# Patient Record
Sex: Female | Born: 1950 | Race: White | Hispanic: No | Marital: Married
Health system: Southern US, Community
[De-identification: ages and names within clinical notes are randomized; demographics above are authoritative.]

## PROBLEM LIST (undated history)

## (undated) DIAGNOSIS — M8580 Other specified disorders of bone density and structure, unspecified site: Secondary | ICD-10-CM

## (undated) DIAGNOSIS — I10 Essential (primary) hypertension: Secondary | ICD-10-CM

## (undated) DIAGNOSIS — E229 Hyperfunction of pituitary gland, unspecified: Secondary | ICD-10-CM

## (undated) DIAGNOSIS — N200 Calculus of kidney: Secondary | ICD-10-CM

## (undated) DIAGNOSIS — R7989 Other specified abnormal findings of blood chemistry: Secondary | ICD-10-CM

## (undated) HISTORY — PX: OTHER SURGICAL HISTORY: SHX169

## (undated) HISTORY — DX: Hyperfunction of pituitary gland, unspecified: E22.9

## (undated) HISTORY — DX: Other specified abnormal findings of blood chemistry: R79.89

## (undated) HISTORY — DX: Other specified disorders of bone density and structure, unspecified site: M85.80

## (undated) HISTORY — DX: Essential (primary) hypertension: I10

## (undated) HISTORY — DX: Calculus of kidney: N20.0

---

## 1995-01-02 HISTORY — PX: CYSTOSCOPY W/ URETERAL STENT PLACEMENT: SHX1429

## 1997-08-06 ENCOUNTER — Ambulatory Visit (HOSPITAL_BASED_OUTPATIENT_CLINIC_OR_DEPARTMENT_OTHER): Admission: RE | Admit: 1997-08-06 | Discharge: 1997-08-06 | Payer: Self-pay | Admitting: Urology

## 1997-08-12 ENCOUNTER — Ambulatory Visit (HOSPITAL_COMMUNITY): Admission: RE | Admit: 1997-08-12 | Discharge: 1997-08-12 | Payer: Self-pay | Admitting: Urology

## 1997-08-27 ENCOUNTER — Other Ambulatory Visit: Admission: RE | Admit: 1997-08-27 | Discharge: 1997-08-27 | Payer: Self-pay | Admitting: Obstetrics and Gynecology

## 1997-10-11 ENCOUNTER — Ambulatory Visit (HOSPITAL_COMMUNITY): Admission: RE | Admit: 1997-10-11 | Discharge: 1997-10-11 | Payer: Self-pay | Admitting: Urology

## 1997-10-11 ENCOUNTER — Encounter: Payer: Self-pay | Admitting: Urology

## 1998-10-24 ENCOUNTER — Other Ambulatory Visit: Admission: RE | Admit: 1998-10-24 | Discharge: 1998-10-24 | Payer: Self-pay | Admitting: Gynecology

## 1999-09-08 ENCOUNTER — Encounter: Admission: RE | Admit: 1999-09-08 | Discharge: 1999-09-08 | Payer: Self-pay | Admitting: Urology

## 1999-09-08 ENCOUNTER — Encounter: Payer: Self-pay | Admitting: Urology

## 1999-10-27 ENCOUNTER — Other Ambulatory Visit: Admission: RE | Admit: 1999-10-27 | Discharge: 1999-10-27 | Payer: Self-pay | Admitting: Gynecology

## 2000-11-22 ENCOUNTER — Encounter: Admission: RE | Admit: 2000-11-22 | Discharge: 2000-11-22 | Payer: Self-pay | Admitting: Urology

## 2000-11-22 ENCOUNTER — Encounter: Payer: Self-pay | Admitting: Urology

## 2000-12-13 ENCOUNTER — Other Ambulatory Visit: Admission: RE | Admit: 2000-12-13 | Discharge: 2000-12-13 | Payer: Self-pay | Admitting: Gynecology

## 2001-11-07 ENCOUNTER — Encounter: Payer: Self-pay | Admitting: Urology

## 2001-11-07 ENCOUNTER — Encounter: Admission: RE | Admit: 2001-11-07 | Discharge: 2001-11-07 | Payer: Self-pay | Admitting: Urology

## 2002-04-14 ENCOUNTER — Other Ambulatory Visit: Admission: RE | Admit: 2002-04-14 | Discharge: 2002-04-14 | Payer: Self-pay | Admitting: Gynecology

## 2003-05-03 ENCOUNTER — Other Ambulatory Visit: Admission: RE | Admit: 2003-05-03 | Discharge: 2003-05-03 | Payer: Self-pay | Admitting: Gynecology

## 2003-10-28 ENCOUNTER — Ambulatory Visit (HOSPITAL_COMMUNITY): Admission: RE | Admit: 2003-10-28 | Discharge: 2003-10-28 | Payer: Self-pay | Admitting: Urology

## 2004-06-22 ENCOUNTER — Other Ambulatory Visit: Admission: RE | Admit: 2004-06-22 | Discharge: 2004-06-22 | Payer: Self-pay | Admitting: Gynecology

## 2005-08-31 ENCOUNTER — Other Ambulatory Visit: Admission: RE | Admit: 2005-08-31 | Discharge: 2005-08-31 | Payer: Self-pay | Admitting: Gynecology

## 2005-09-11 ENCOUNTER — Encounter: Admission: RE | Admit: 2005-09-11 | Discharge: 2005-09-11 | Payer: Self-pay | Admitting: Gastroenterology

## 2005-10-05 ENCOUNTER — Encounter: Admission: RE | Admit: 2005-10-05 | Discharge: 2005-10-05 | Payer: Self-pay | Admitting: Gynecology

## 2005-10-26 ENCOUNTER — Ambulatory Visit (HOSPITAL_COMMUNITY): Admission: RE | Admit: 2005-10-26 | Discharge: 2005-10-26 | Payer: Self-pay | Admitting: Gastroenterology

## 2007-01-02 DIAGNOSIS — M8580 Other specified disorders of bone density and structure, unspecified site: Secondary | ICD-10-CM

## 2007-01-02 HISTORY — DX: Other specified disorders of bone density and structure, unspecified site: M85.80

## 2007-01-02 HISTORY — PX: LITHOTRIPSY: SUR834

## 2007-02-03 ENCOUNTER — Ambulatory Visit (HOSPITAL_COMMUNITY): Admission: RE | Admit: 2007-02-03 | Discharge: 2007-02-03 | Payer: Self-pay | Admitting: Urology

## 2007-09-12 ENCOUNTER — Ambulatory Visit: Payer: Self-pay | Admitting: Women's Health

## 2007-09-12 ENCOUNTER — Encounter: Payer: Self-pay | Admitting: Women's Health

## 2007-09-12 ENCOUNTER — Other Ambulatory Visit: Admission: RE | Admit: 2007-09-12 | Discharge: 2007-09-12 | Payer: Self-pay | Admitting: Gynecology

## 2007-09-15 ENCOUNTER — Ambulatory Visit: Payer: Self-pay | Admitting: Women's Health

## 2007-10-07 ENCOUNTER — Ambulatory Visit: Payer: Self-pay | Admitting: Women's Health

## 2007-10-14 ENCOUNTER — Ambulatory Visit: Payer: Self-pay | Admitting: Obstetrics and Gynecology

## 2007-12-01 ENCOUNTER — Ambulatory Visit (HOSPITAL_COMMUNITY): Admission: RE | Admit: 2007-12-01 | Discharge: 2007-12-01 | Payer: Self-pay | Admitting: Urology

## 2008-01-09 ENCOUNTER — Ambulatory Visit: Payer: Self-pay | Admitting: Obstetrics and Gynecology

## 2008-05-03 IMAGING — CR DG ABDOMEN 1V
2 series · 2 of 2 positions shown · non-contrast
Comparison: 10/28/03.

CLINICAL DATA: 56-year-old female preoperative study for right renal stone.  
 ABDOMEN ? 1 VIEW:

[t abdomen supine (1 of 2)]
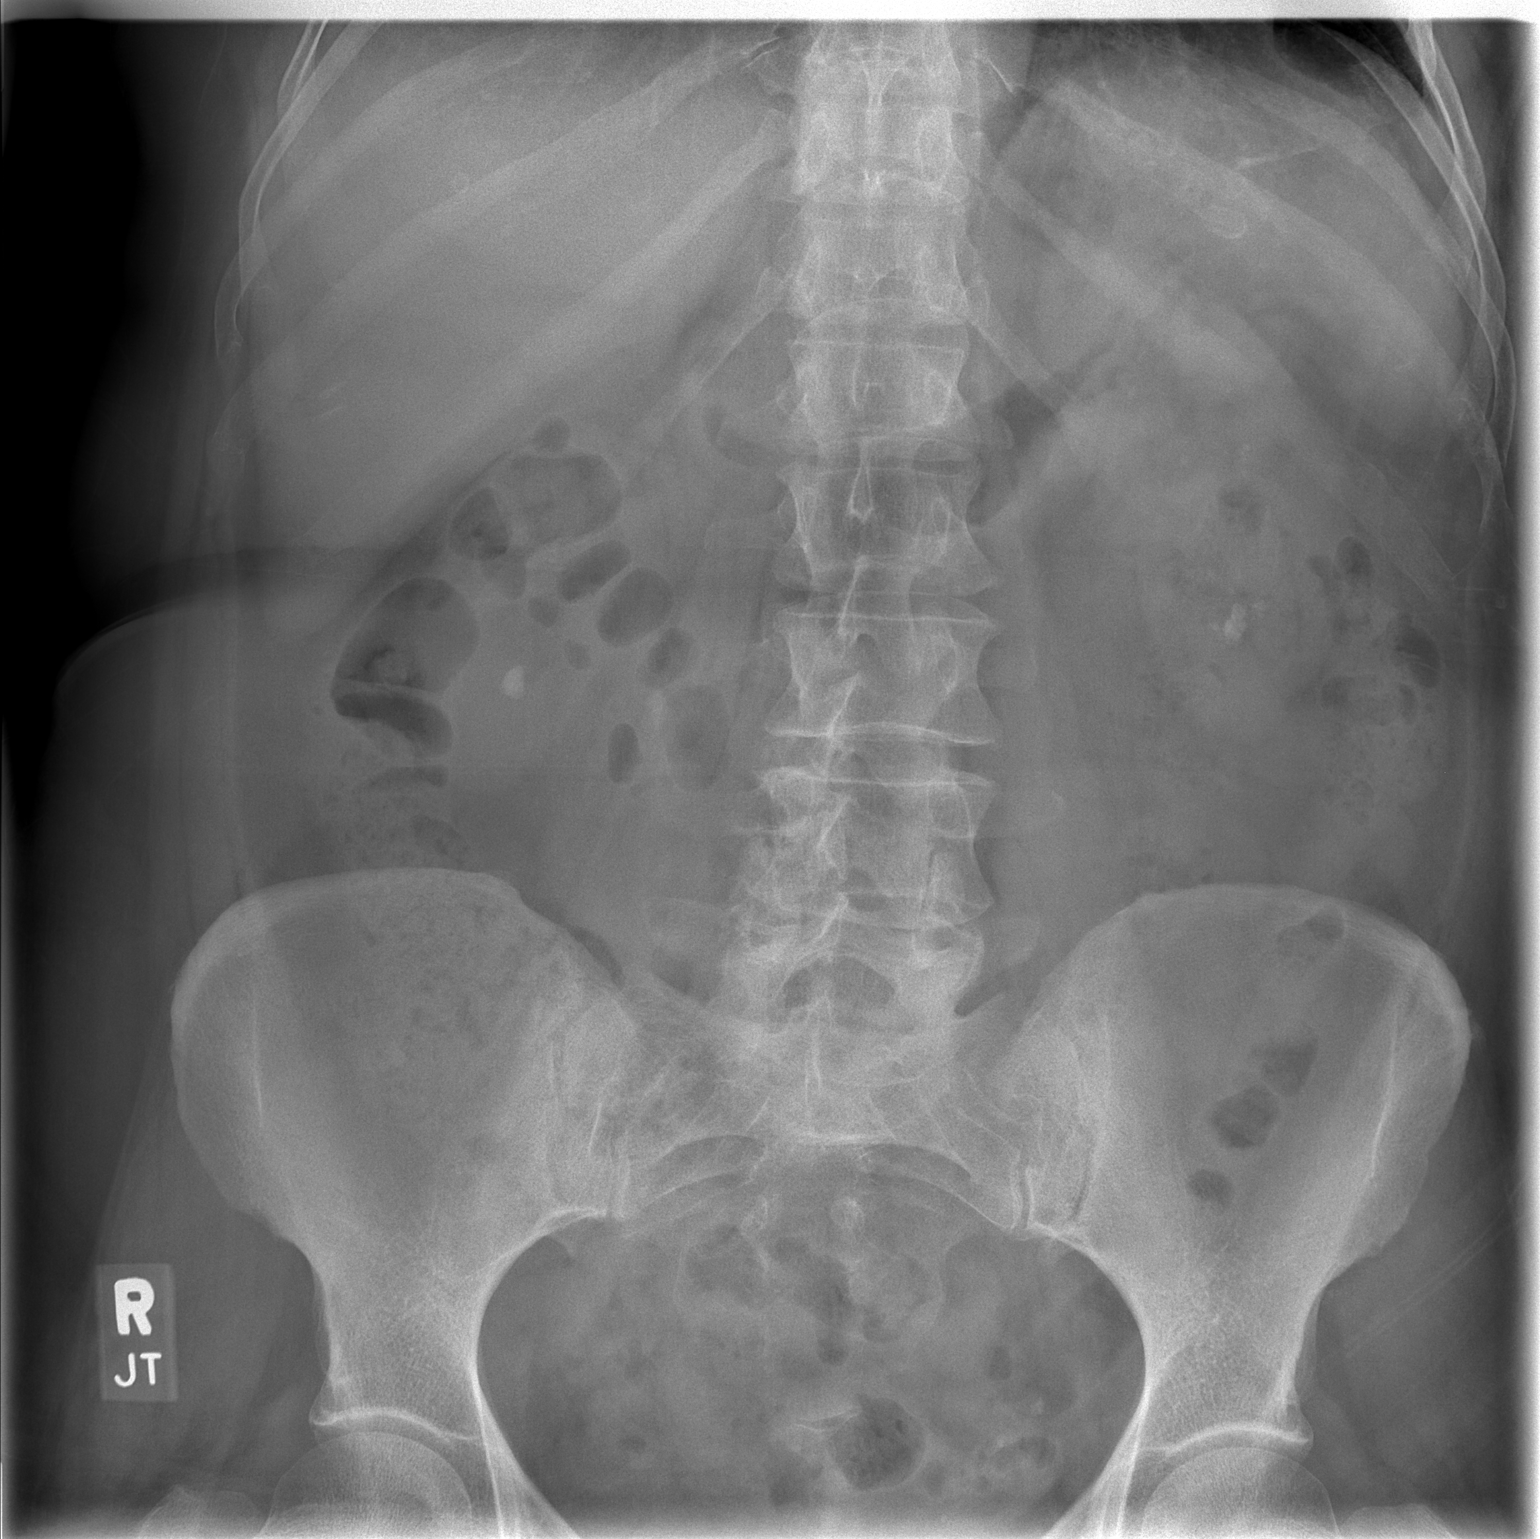

[t abdomen supine (2 of 2)]
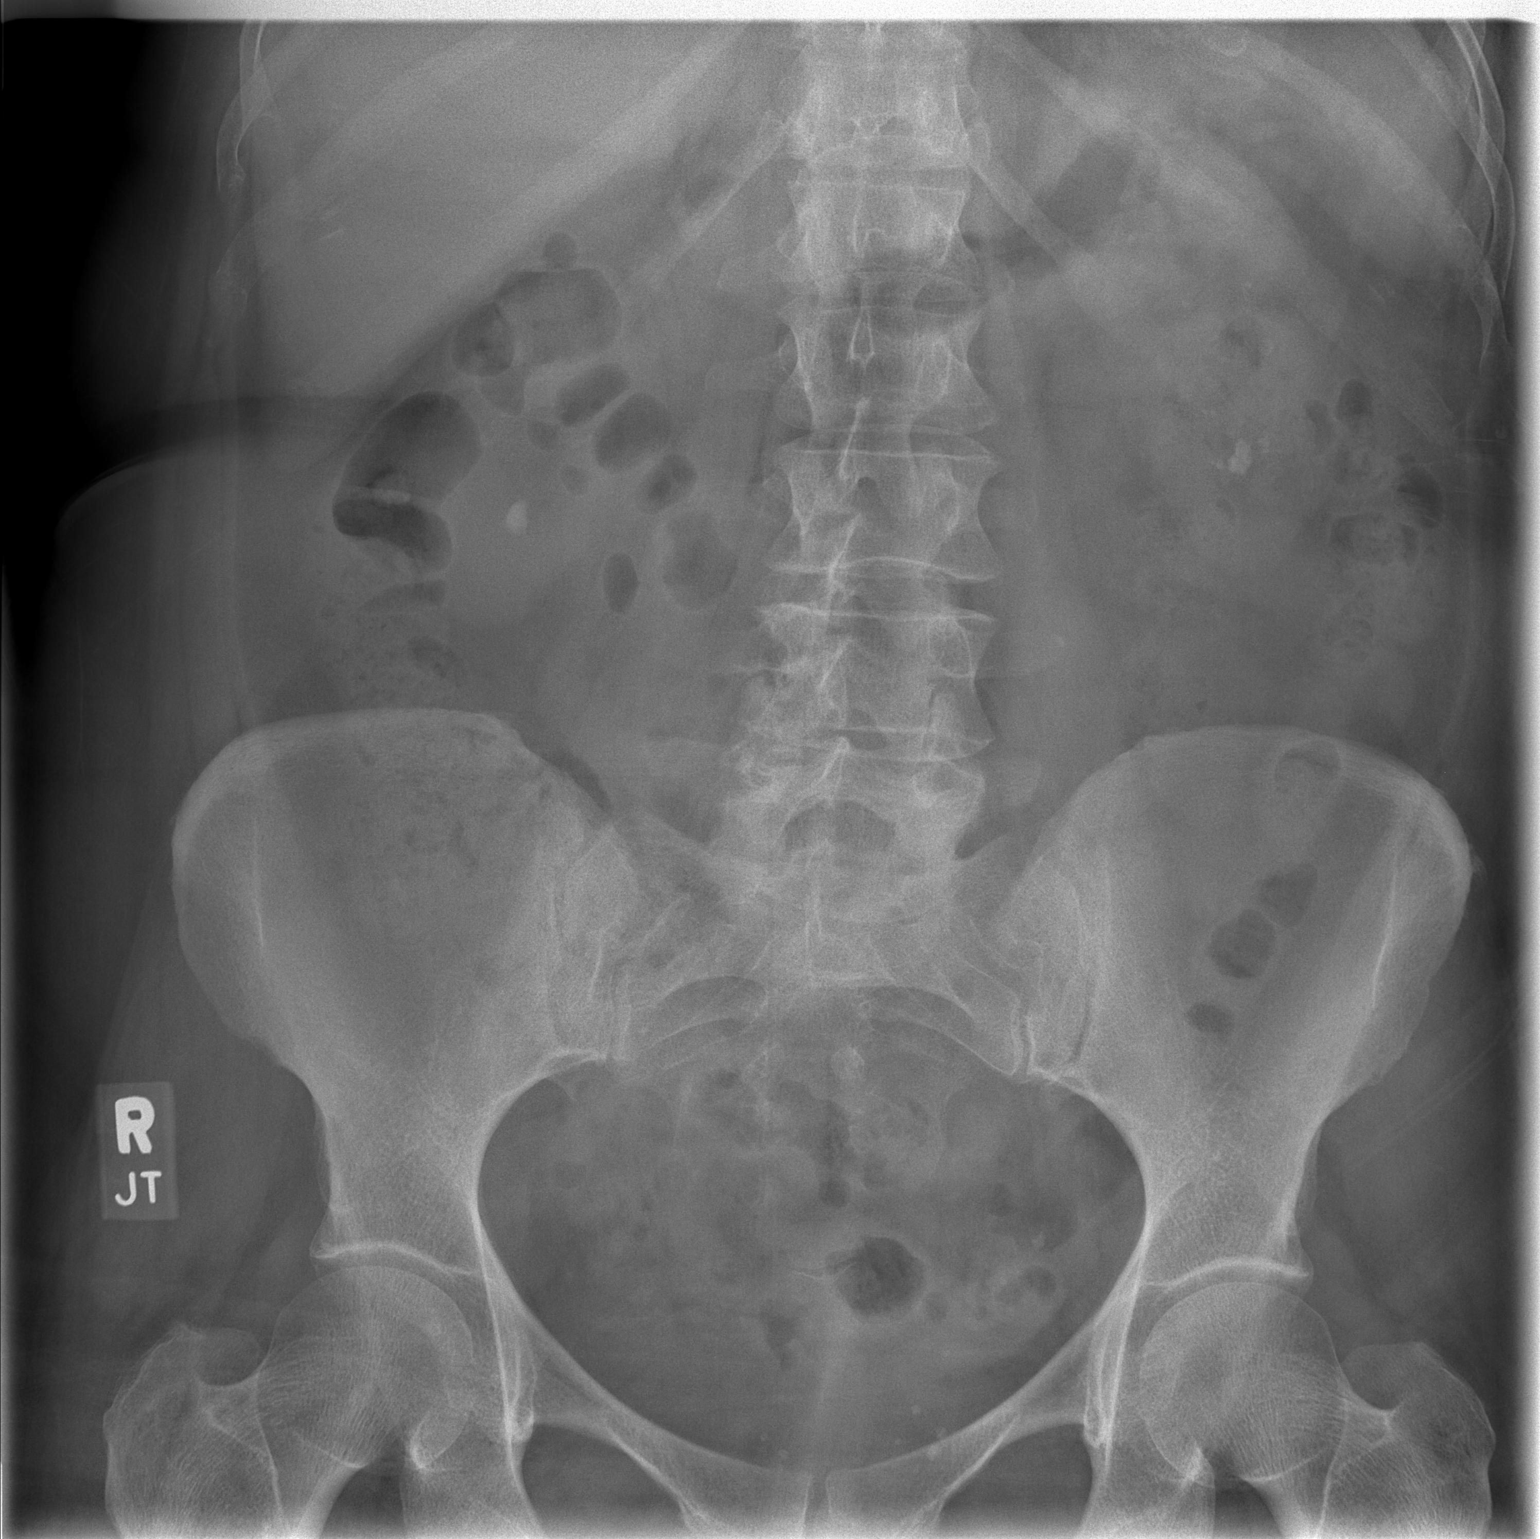

[2 of 2 positions shown; findings below may reference images not displayed]

FINDINGS: An 8-9 mm calculus projects over the right renal lower pole.  An irregular cluster of calcification is re-identified projecting over the left lower pole.  These have mildly progressed since the prior exam.  Stable multiple pelvic phleboliths are noted.  No definite abnormal calcific density along the course of either ureter with interval resolution of the 7-8 mm left ureteral calculus evident on the prior exam.  Stable mild rotatory scoliosis.  No acute osseous abnormality.  Nonobstructive bowel gas pattern.  Visceral contours are within normal limits.
IMPRESSION: 1.  Bilateral lower pole nephrolithiasis suspected with a solitary 8-9 mm calculus on the right and a cluster of smaller calculi on the left. 
 2.  No definite ureteral calculus seen.

## 2008-09-20 ENCOUNTER — Ambulatory Visit: Payer: Self-pay | Admitting: Women's Health

## 2008-09-20 ENCOUNTER — Encounter: Payer: Self-pay | Admitting: Women's Health

## 2008-09-20 ENCOUNTER — Other Ambulatory Visit: Admission: RE | Admit: 2008-09-20 | Discharge: 2008-09-20 | Payer: Self-pay | Admitting: Obstetrics and Gynecology

## 2010-02-13 ENCOUNTER — Encounter: Payer: BC Managed Care – PPO | Admitting: Women's Health

## 2010-02-13 ENCOUNTER — Other Ambulatory Visit (HOSPITAL_COMMUNITY)
Admission: RE | Admit: 2010-02-13 | Discharge: 2010-02-13 | Disposition: A | Discharge status: Home / Self Care | Payer: BC Managed Care – PPO | Source: Ambulatory Visit | Attending: Obstetrics and Gynecology | Admitting: Obstetrics and Gynecology

## 2010-02-13 ENCOUNTER — Other Ambulatory Visit: Payer: Self-pay | Admitting: Women's Health

## 2010-02-13 DIAGNOSIS — Z13220 Encounter for screening for lipoid disorders: Secondary | ICD-10-CM

## 2010-02-13 DIAGNOSIS — Z01419 Encounter for gynecological examination (general) (routine) without abnormal findings: Secondary | ICD-10-CM

## 2010-02-13 DIAGNOSIS — R82998 Other abnormal findings in urine: Secondary | ICD-10-CM

## 2010-02-13 DIAGNOSIS — Z124 Encounter for screening for malignant neoplasm of cervix: Secondary | ICD-10-CM | POA: Insufficient documentation

## 2010-02-13 DIAGNOSIS — Z833 Family history of diabetes mellitus: Secondary | ICD-10-CM

## 2010-03-07 ENCOUNTER — Encounter (INDEPENDENT_AMBULATORY_CARE_PROVIDER_SITE_OTHER): Payer: BC Managed Care – PPO

## 2010-03-07 DIAGNOSIS — M949 Disorder of cartilage, unspecified: Secondary | ICD-10-CM

## 2010-05-19 NOTE — Op Note (Signed)
NAME:  Bridget Weeks, Bridget Weeks                 ACCOUNT NO.:  [id number]   MEDICAL RECORD NO.:  [id number]          PATIENT TYPE:  AMB   LOCATION:  ENDO                         FACILITY:  [facility]   PHYSICIAN:  Anselmo Rod, M.D.  DATE OF BIRTH:  03-23-1950   DATE OF PROCEDURE:  10/26/2005  DATE OF DISCHARGE:  10/26/2005                                 OPERATIVE REPORT   PROCEDURE PERFORMED:  Screening colonoscopy.   ENDOSCOPIST:  Anselmo Rod, M.D.   INSTRUMENT USED:  Olympus video colonoscope.   INDICATION FOR PROCEDURE:  A 61 year old white female undergoing screening  colonoscopy.  Rule out colonic polyps, masses, etc.   PREPROCEDURE PREPARATION:  Informed consent was procured from the patient.  The patient was fasted for 8 hours prior to the procedure and prepped with  Dulcolax pills and a gallon of TriLyte the night prior to the procedure.  The risks and benefits of the procedure including a 10% miss rate of cancer  and polyp were discussed with the patient as well.   PREPROCEDURE PHYSICAL:  The patient had stable vital signs.  NECK:  Supple.  CHEST:  Clear to auscultation.  S1, S2 regular.  ABDOMEN:  Soft with normal bowel sounds.   DESCRIPTION OF THE PROCEDURE:  The patient was placed in the left lateral  decubitus position, sedated with 75 mcg of fentanyl and 7.5 mg of Versed in  slow incremental doses.  Once the patient was adequately sedated and  maintained on low flow oxygen and continuous cardiac monitoring, the Olympus  video colonoscope was advanced from the rectum to the cecum.  The  appendiceal orifice and ileocecal valve were clearly visualized and  photographed.  Multiple washings were done.  No masses, polyps, erosions,  ulcerations or diverticula were seen.  Small internal hemorrhoids were  appreciated on retroflexion in the rectum.  The patient tolerated the  procedure well without immediate complications.   IMPRESSION:  1. Normal colonoscopy up to the cecum.  No  masses, polyps or diverticula      seen.  2. Small internal hemorrhoids seen on retroflexion.   RECOMMENDATIONS:  1. Continue a high fiber diet with liberal fluid intake.  2. Repeat colonoscopy in the next 10 years.  If the patient develops any      abnormal symptoms in the interim she should contact the office      immediately for further recommendations.  3. Outpatient follow up as need arises in the future.      Anselmo Rod, M.D.  Electronically Signed     JNM/MEDQ  D:  10/26/2005  T:  10/28/2005  Job:  [id number]   cc:   Gaetano Hawthorne. Lily Peer, M.D.

## 2010-09-22 LAB — CBC
HCT: 41.2
Hemoglobin: 14.2
MCV: 89.1
Platelets: 373
RBC: 4.62
WBC: 5.9

## 2010-09-22 LAB — BASIC METABOLIC PANEL
CO2: 26
Chloride: 110
Potassium: 4

## 2011-03-28 DIAGNOSIS — M8580 Other specified disorders of bone density and structure, unspecified site: Secondary | ICD-10-CM | POA: Insufficient documentation

## 2011-03-28 DIAGNOSIS — I10 Essential (primary) hypertension: Secondary | ICD-10-CM | POA: Insufficient documentation

## 2011-03-28 DIAGNOSIS — N200 Calculus of kidney: Secondary | ICD-10-CM | POA: Insufficient documentation

## 2011-04-02 ENCOUNTER — Encounter: Payer: Self-pay | Admitting: Women's Health

## 2011-04-02 ENCOUNTER — Ambulatory Visit (INDEPENDENT_AMBULATORY_CARE_PROVIDER_SITE_OTHER): Payer: BC Managed Care – PPO | Admitting: Women's Health

## 2011-04-02 VITALS — BP 120/80 | Ht 69.0 in | Wt 189.0 lb

## 2011-04-02 DIAGNOSIS — E079 Disorder of thyroid, unspecified: Secondary | ICD-10-CM

## 2011-04-02 DIAGNOSIS — Z01419 Encounter for gynecological examination (general) (routine) without abnormal findings: Secondary | ICD-10-CM

## 2011-04-02 DIAGNOSIS — Z13220 Encounter for screening for lipoid disorders: Secondary | ICD-10-CM

## 2011-04-02 DIAGNOSIS — Z833 Family history of diabetes mellitus: Secondary | ICD-10-CM

## 2011-04-02 LAB — HEMOGLOBIN A1C: Hgb A1c MFr Bld: 5.7 % — ABNORMAL HIGH (ref <5.7)

## 2011-04-02 LAB — CBC WITH DIFFERENTIAL/PLATELET
Basophils Absolute: 0 10*3/uL (ref 0.0–0.1)
Basophils Relative: 0 % (ref 0–1)
Eosinophils Relative: 2 % (ref 0–5)
HCT: 46.7 % — ABNORMAL HIGH (ref 36.0–46.0)
Hemoglobin: 15.4 g/dL — ABNORMAL HIGH (ref 12.0–15.0)
MCH: 30.6 pg (ref 26.0–34.0)
MCHC: 33 g/dL (ref 30.0–36.0)
MCV: 92.8 fL (ref 78.0–100.0)
Monocytes Absolute: 0.5 10*3/uL (ref 0.1–1.0)
Monocytes Relative: 10 % (ref 3–12)
Neutro Abs: 2.7 10*3/uL (ref 1.7–7.7)
RDW: 13 % (ref 11.5–15.5)

## 2011-04-02 LAB — LIPID PANEL
Cholesterol: 194 mg/dL (ref 0–200)
LDL Cholesterol: 100 mg/dL — ABNORMAL HIGH (ref 0–99)
Total CHOL/HDL Ratio: 3 Ratio
VLDL: 30 mg/dL (ref 0–40)

## 2011-04-02 LAB — T4: T4, Total: 9.2 ug/dL (ref 5.0–12.5)

## 2011-04-02 LAB — T3 UPTAKE: T3 Uptake: 30.6 % (ref 22.5–37.0)

## 2011-04-02 NOTE — Progress Notes (Signed)
Bridget Weeks Feb 06, 1950 [id number]    History:    The patient presents for annual exam.  Postmenopausal with no bleeding on no HRT. History of normal mammograms and Paps. History of increased prolactin with a negative MRI in 1997 and 2007. Normal colonoscopy in 2007. History of osteopenia T score -2.1 at the spine, total hip -0.2   03/2010. Has lost 25 pounds with diet and exercise in the past year. Hypertensive-primary care   Past medical history, past surgical history, family history and social history were all reviewed and documented in the EPIC chart. Owns a local newspaper.   ROS:  A  ROS was performed and pertinent positives and negatives are included in the history.  Exam:  Filed Vitals:   04/02/11 0939  BP: 120/80    General appearance:  Normal Head/Neck:  Normal, without cervical or supraclavicular adenopathy. Thyroid:  Symmetrical, normal in size, without palpable masses or nodularity. Respiratory  Effort:  Normal  Auscultation:  Clear without wheezing or rhonchi Cardiovascular  Auscultation:  Regular rate, without rubs, murmurs or gallops  Edema/varicosities:  Not grossly evident Abdominal  Soft,nontender, without masses, guarding or rebound.  Liver/spleen:  No organomegaly noted  Hernia:  None appreciated  Skin  Inspection:  Grossly normal  Palpation:  Grossly normal Neurologic/psychiatric  Orientation:  Normal with appropriate conversation.  Mood/affect:  Normal  Genitourinary    Breasts: Examined lying and sitting.     Right: Without masses, retractions, discharge or axillary adenopathy.     Left: Without masses, retractions, discharge or axillary adenopathy.   Inguinal/mons:  Normal without inguinal adenopathy  External genitalia:  Normal  BUS/Urethra/Skene's glands:  Normal  Bladder:  Normal  Vagina:  +1 rectocele   Cervix:  Normal  Uterus:   normal in size, shape and contour.  Midline and mobile  Adnexa/parametria:     Rt: Without masses or  tenderness.   Lt: Without masses or tenderness.  Anus and perineum: Normal  Digital rectal exam: Normal sphincter tone without palpated masses or tenderness  Assessment/Plan:  61 y.o. M. WF G2 P2  for annual exam with no complaints.  Normal postmenopausal exam no HRT Osteopenia T score -2.1 at spine/bilateral hip average -0.01/2010 Hypertension-meds primary care  Plan: Working with a nutritionist for weight loss and health requested labs to be done and mailed.  CBC, hemoglobin A1c, glucose, thyroid panel, lipid profile, UA.  Home Hemoccult card given. SBE's, continue annual mammogram, calcium rich diet, vitamin D 2000 daily encouraged. Continue healthy diet/ lifestyle. Zostovac vaccine reviewed. Home safety and fall prevention reviewed. Reviewed importance of exercise in relationship to bone health. ACOG's pap recommendations reviewed.    Harrington Challenger [facility], 6:24 PM 04/02/2011

## 2011-04-02 NOTE — Patient Instructions (Signed)
1500 Calorie Diabetic Diet The 1500 calorie diabetic diet limits calories to 1500 each day. Following this diet and making healthy meal choices can help improve overall health. It controls blood glucose (sugar) levels and can also help lower blood pressure and cholesterol.  SERVING SIZES Measuring foods and serving sizes helps to make sure you are getting the right amount of food. The list below tells how big or small some common serving sizes are.  1 oz.........4 stacked dice.   3 oz........Marland KitchenDeck of cards.   1 tsp.......Marland KitchenTip of little finger.   1 tbs......Marland KitchenMarland KitchenThumb.   2 tbs.......Marland KitchenGolf ball.    cup......Marland KitchenHalf of a fist.   1 cup.......Marland KitchenA fist.  GUIDELINES FOR CHOOSING FOODS The goal of this diet is to eat a variety of foods and limit calories to 1500 each day. This can be done by choosing foods that are low in calories and fat. The diet also suggests eating small amounts of food frequently. Doing this helps control your blood glucose levels, so they do not get too high or too low. Each meal or snack may include a protein food source to help you feel more satisfied. Try to eat about the same amount of food around the same time each day. This includes weekend days, travel days, and days off work. Space your meals about 4 to 5 hours apart, and add a snack between them, if you wish.  For example, a daily food plan could include breakfast, a morning snack, lunch, dinner, and an evening snack. Healthy meals and snacks have different types of foods, including whole grains, vegetables, fruits, lean meats, poultry, fish, and dairy products. As you plan your meals, select a variety of foods. Choose from the bread and starch, vegetable, fruit, dairy, and meat/protein groups. Examples of foods from each group are listed below, with their suggested serving sizes. Use measuring cups and spoons to become familiar with what a healthy portion looks like. Bread and Starch Each serving equals 15 grams of  carbohydrate.  1 slice bread.    bagel.    cup cold cereal (unsweetened).    cup hot cereal or mashed potatoes.   1 small potato (size of a computer mouse).   ? cup cooked pasta or rice.    English muffin.   1 cup broth-based soup.   3 cups of popcorn.   4 to 6 whole-wheat crackers.    cup cooked beans, peas, or corn.  Vegetables Each serving equals 5 grams of carbohydrate.   cup cooked vegetables.   1 cup raw vegetables.    cup tomato or vegetable juice.  Fruit Each serving equals 15 grams of carbohydrate.  1 small apple or orange.   1  cup watermelon or strawberries.    cup applesauce (no sugar added).   2 tbs raisins.    banana.    cup canned fruit, packed in water or in its own juice.    cup unsweetened fruit juice.  Dairy Each serving equals 12 to 15 grams of carbohydrate.  1 cup fat-free milk.   6 oz artificially sweetened yogurt or plain yogurt.   1 cup low-fat buttermilk.   1 cup soy milk.   1 cup almond milk.  Meat/Protein  1 large egg.   2 to 3 oz meat, poultry, or fish.    cup low-fat cottage cheese.   1 tbs peanut butter.   1 oz low-fat cheese.    cup tuna, packed in water.    cup tofu.  Fat  1 tsp oil.   1 tsp trans-fat-free margarine.   1 tsp butter.   1 tsp mayonnaise.   2 tbs avocado.   1 tbs salad dressing.   1 tbs cream cheese.   2 tbs sour cream.  SAMPLE 1500 CALORIE DIET PLAN Breakfast   whole-wheat English muffin (1 carb serving).   1 tsp trans-fat-free margarine.   1 scrambled egg.   1 cup fat-free milk (1 carb serving).   1 small orange (1 carb serving).  Lunch  Chicken wrap.   1 whole-wheat tortilla, 8-inch (1 carb servings).   2 oz chicken breast, sliced.   2 tbs low-fat salad dressing, such as [place].    cup shredded lettuce.   2 slices tomato.    cup carrot sticks.   1 small apple (1 carb serving).  Afternoon Snack  3 graham cracker squares (1 carb  serving).   1 tbs peanut butter.  Dinner  2 oz lean pork chop, broiled.   1 cup brown rice (3 carb servings).    cup steamed carrots.    cup green beans.   1 cup fat-free milk (1 carb serving).   1 tsp trans-fat-free margarine.  Evening Snack   cup low-fat cottage cheese.   1 small peach or pear, sliced (or  cup canned in water) (1 carb serving).  MEAL PLAN You can use this worksheet to help you make a daily meal plan based on the 1500 calorie diabetic diet suggestions. If you are using this plan to help you control your blood glucose, you may interchange carbohydrate containing foods (dairy, starches, and fruits). Select a variety of fresh foods of varying colors and flavors. The total amount of carbohydrate in your meals or snacks is more important than making sure you include all of the food groups every time you eat. You can choose from approximately this many of the following foods to build your day's meals:  6 Starches.   3 Vegetables.   2 Fruits.   2 Dairy.   4 to 6 oz Meat/Protein.   Up to 3 Fats.  Your dietician can use this worksheet to help you decide how many servings and which types of foods are right for you. BREAKFAST Food Group and Servings / Food Choice Starch _________________________________________________________ Dairy __________________________________________________________ Fruit ___________________________________________________________ Meat/Protein____________________________________________________ Fat ____________________________________________________________ LUNCH Food Group and Servings / Food Choice  Starch _________________________________________________________ Meat/Protein ___________________________________________________ Vegetables _____________________________________________________ Fruit __________________________________________________________ Dairy __________________________________________________________ Fat  ____________________________________________________________ Aura Fey Food Group and Servings / Food Choice Dairy __________________________________________________________ Starch _________________________________________________________ Meat/Protein____________________________________________________ Zada Girt ___________________________________________________________ Laural Golden Food Group and Servings / Food Choice Starch _________________________________________________________ Meat/Protein ___________________________________________________ Dairy __________________________________________________________ Vegetable ______________________________________________________ Fruit ___________________________________________________________ Fat ____________________________________________________________ Lollie Sails Food Group and Servings / Food Choice Fruit ___________________________________________________________ Meat/Protein ____________________________________________________ Dairy __________________________________________________________ Starch __________________________________________________________ DAILY TOTALS Starches _________________________ Vegetables _______________________ Fruits ____________________________ Dairy ____________________________ Meat/Protein_____________________ Fats _____________________________ Document Released: 07/10/2004 Document Revised: 12/07/2010 Document Reviewed: 11/04/2008 [employer] Patient Information 2012 [place], [place].Health Maintenance, Females A healthy lifestyle and preventative care can promote health and wellness.  Maintain regular health, dental, and eye exams.   Eat a healthy diet. Foods like vegetables, fruits, whole grains, low-fat dairy products, and lean protein foods contain the nutrients you need without too many calories. Decrease your intake of foods high in solid fats, added sugars, and salt. Get information about a  proper diet from your caregiver, if necessary.   Regular physical exercise is one of the most important things you can do for your health. Most adults should get at least  150 minutes of moderate-intensity exercise (any activity that increases your heart rate and causes you to sweat) each week. In addition, most adults need muscle-strengthening exercises on 2 or more days a week.    Maintain a healthy weight. The body mass index (BMI) is a screening tool to identify possible weight problems. It provides an estimate of body fat based on height and weight. Your caregiver can help determine your BMI, and can help you achieve or maintain a healthy weight. For adults 20 years and older:   A BMI below 18.5 is considered underweight.   A BMI of 18.5 to 24.9 is normal.   A BMI of 25 to 29.9 is considered overweight.   A BMI of 30 and above is considered obese.   Maintain normal blood lipids and cholesterol by exercising and minimizing your intake of saturated fat. Eat a balanced diet with plenty of fruits and vegetables. Blood tests for lipids and cholesterol should begin at age 22 and be repeated every 5 years. If your lipid or cholesterol levels are high, you are over 50, or you are a high risk for heart disease, you may need your cholesterol levels checked more frequently.Ongoing high lipid and cholesterol levels should be treated with medicines if diet and exercise are not effective.   If you smoke, find out from your caregiver how to quit. If you do not use tobacco, do not start.   If you are pregnant, do not drink alcohol. If you are breastfeeding, be very cautious about drinking alcohol. If you are not pregnant and choose to drink alcohol, do not exceed 1 drink per day. One drink is considered to be 12 ounces (355 mL) of beer, 5 ounces (148 mL) of wine, or 1.5 ounces (44 mL) of liquor.   Avoid use of street drugs. Do not share needles with anyone. Ask for help if you need support or instructions  about stopping the use of drugs.   High blood pressure causes heart disease and increases the risk of stroke. Blood pressure should be checked at least every 1 to 2 years. Ongoing high blood pressure should be treated with medicines, if weight loss and exercise are not effective.   If you are 44 to 63 years old, ask your caregiver if you should take aspirin to prevent strokes.   Diabetes screening involves taking a blood sample to check your fasting blood sugar level. This should be done once every 3 years, after age 27, if you are within normal weight and without risk factors for diabetes. Testing should be considered at a younger age or be carried out more frequently if you are overweight and have at least 1 risk factor for diabetes.   Breast cancer screening is essential preventative care for women. You should practice "breast self-awareness." This means understanding the normal appearance and feel of your breasts and may include breast self-examination. Any changes detected, no matter how small, should be reported to a caregiver. Women in their 68s and 30s should have a clinical breast exam (CBE) by a caregiver as part of a regular health exam every 1 to 3 years. After age 110, women should have a CBE every year. Starting at age 58, women should consider having a mammogram (breast X-ray) every year. Women who have a family history of breast cancer should talk to their caregiver about genetic screening. Women at a high risk of breast cancer should talk to their caregiver about having an MRI and a mammogram every year.  The Pap test is a screening test for cervical cancer. Women should have a Pap test starting at age 11. Between ages 58 and 60, Pap tests should be repeated every 2 years. Beginning at age 97, you should have a Pap test every 3 years as long as the past 3 Pap tests have been normal. If you had a hysterectomy for a problem that was not cancer or a condition that could lead to cancer, then  you no longer need Pap tests. If you are between ages 64 and 56, and you have had normal Pap tests going back 10 years, you no longer need Pap tests. If you have had past treatment for cervical cancer or a condition that could lead to cancer, you need Pap tests and screening for cancer for at least 20 years after your treatment. If Pap tests have been discontinued, risk factors (such as a new sexual partner) need to be reassessed to determine if screening should be resumed. Some women have medical problems that increase the chance of getting cervical cancer. In these cases, your caregiver may recommend more frequent screening and Pap tests.   The human papillomavirus (HPV) test is an additional test that may be used for cervical cancer screening. The HPV test looks for the virus that can cause the cell changes on the cervix. The cells collected during the Pap test can be tested for HPV. The HPV test could be used to screen women aged 63 years and older, and should be used in women of any age who have unclear Pap test results. After the age of 32, women should have HPV testing at the same frequency as a Pap test.   Colorectal cancer can be detected and often prevented. Most routine colorectal cancer screening begins at the age of 87 and continues through age 37. However, your caregiver may recommend screening at an earlier age if you have risk factors for colon cancer. On a yearly basis, your caregiver may provide home test kits to check for hidden blood in the stool. Use of a small camera at the end of a tube, to directly examine the colon (sigmoidoscopy or colonoscopy), can detect the earliest forms of colorectal cancer. Talk to your caregiver about this at age 69, when routine screening begins. Direct examination of the colon should be repeated every 5 to 10 years through age 69, unless early forms of pre-cancerous polyps or small growths are found.   Hepatitis C blood testing is recommended for all people  born from [birth year] through 1967 and any individual with known risks for hepatitis C.   Practice safe sex. Use condoms and avoid high-risk sexual practices to reduce the spread of sexually transmitted infections (STIs). Sexually active women aged 65 and younger should be checked for Chlamydia, which is a common sexually transmitted infection. Older women with new or multiple partners should also be tested for Chlamydia. Testing for other STIs is recommended if you are sexually active and at increased risk.   Osteoporosis is a disease in which the bones lose minerals and strength with aging. This can result in serious bone fractures. The risk of osteoporosis can be identified using a bone density scan. Women ages 73 and over and women at risk for fractures or osteoporosis should discuss screening with their caregivers. Ask your caregiver whether you should be taking a calcium supplement or vitamin D to reduce the rate of osteoporosis.   Menopause can be associated with physical symptoms and risks. Hormone replacement therapy  is available to decrease symptoms and risks. You should talk to your caregiver about whether hormone replacement therapy is right for you.   Use sunscreen with a sun protection factor (SPF) of 30 or greater. Apply sunscreen liberally and repeatedly throughout the day. You should seek shade when your shadow is shorter than you. Protect yourself by wearing long sleeves, pants, a wide-brimmed hat, and sunglasses year round, whenever you are outdoors.   Notify your caregiver of new moles or changes in moles, especially if there is a change in shape or color. Also notify your caregiver if a mole is larger than the size of a pencil eraser.   Stay current with your immunizations.  Document Released: 07/03/2010 Document Revised: 12/07/2010 Document Reviewed: 07/03/2010 [facility] Patient Information 2012 [place], [place].

## 2011-04-03 LAB — URINALYSIS W MICROSCOPIC + REFLEX CULTURE
Bacteria, UA: NONE SEEN
Bilirubin Urine: NEGATIVE
Ketones, ur: NEGATIVE mg/dL
Nitrite: NEGATIVE
Protein, ur: NEGATIVE mg/dL
Squamous Epithelial / LPF: NONE SEEN
Urobilinogen, UA: 0.2 mg/dL (ref 0.0–1.0)

## 2011-07-16 ENCOUNTER — Encounter: Payer: Self-pay | Admitting: Women's Health

## 2012-11-06 ENCOUNTER — Other Ambulatory Visit: Payer: Self-pay | Admitting: Urology

## 2012-11-10 ENCOUNTER — Encounter (HOSPITAL_COMMUNITY): Payer: Self-pay | Admitting: *Deleted

## 2012-11-10 ENCOUNTER — Encounter (HOSPITAL_COMMUNITY): Payer: Self-pay | Admitting: Pharmacy Technician

## 2012-11-14 NOTE — H&P (Signed)
Reason for visit  Elective ESWL for increasing left-sided stone burden     History of Present Illness   Bridget Weeks presents today for her annual followup. Clinically, she has continued to do well. She has been followed for close to 20 yeas for nephrolithiasis. She has been documented in the distant past to have hypercalciuria and hyperoxaluria. She has been on thiazide diuretic for some time, but has not had any urine reassessments. She is known to have substantial bilateral stone burden, which we are watching. She has had 3-4 lithotripsies over the years, which have worked reasonably well for her. She is currently without any significant abdominal or flank pain. A urinalysis is relatively unremarkable.   On KUB today, the right-sided stone burden appears to be relatively unchanged. She has at least 6-8 discrete stones. There is some bowel gas obscuring a portion of the KUB. I do not see a dramatic increase in the number or size of the stones. On the left side, she has stable significant stone burden in the lower pole. This is probably several stones together. In the mid pole, there are 2 stones, which have clearly increased in size and the overall stone burden is around 10-12 mm.   Past Medical History Problems  1. History of osteopenia (V13.59)  Surgical History Problems  1. History of Lithotripsy  Current Meds 1. Aspirin Low Dose 81 MG Oral Tablet; TAKE 1 TABLET DAILY;  Therapy: (Recorded:12Apr2012) to Recorded 2. Bellamine TABS;  Therapy: (Recorded:09Aug2013) to Recorded 3. Hydrochlorothiazide 25 MG Oral Tablet; take 1 tablet by mouth once daily;  Therapy: 19Mar2010 to (Evaluate:15Dec2014)  Requested for: 16Sep2014; Last  Rx:16Sep2014 Ordered 4. Vitamin D TABS; 1000u 1 per day;  Therapy: (Recorded:12Apr2012) to Recorded  Allergies Medication  1. Penicillins 2. Shellfish-derived Products  Family History Problems  1. Family history of Diabetes Mellitus (V18.0) : Father 2.  Family history of Diabetes Mellitus (V18.0) : Mother 3. Family history of Hypertension : Father 4. Family history of Hypertension : Mother  Social History Problems  1. Denied: Alcohol Use 2. Caffeine Use   2/week 3. Family history of Death In The Family Father   Age 15, Heart attack 4. Family history of Death In The Family Mother   Age 51 5. Marital History - Currently Married 6. Never A Smoker 7. Occupation:   Immunologist 8. Denied: Tobacco Use (V15.82)  Review of Systems Genitourinary, constitutional, skin, eye, otolaryngeal, hematologic/lymphatic, cardiovascular, pulmonary, endocrine, musculoskeletal, gastrointestinal, neurological and psychiatric system(s) were reviewed and pertinent findings if present are noted.  Genitourinary: urinary urgency, but no hematuria, no pelvic pain and no suprapubic pain.  Gastrointestinal: no flank pain and no abdominal pain.    Vitals Vital Signs [Data Includes: Last 1 Day]  Recorded: 30Oct2014 02:48PM  Height: 5 ft 9 in Weight: 207 lb  BMI Calculated: 30.57 BSA Calculated: 2.1 Blood Pressure: 136 / 81 Temperature: 98.4 F Heart Rate: 66   Well-developed well-nourished female in no acute distress Respiratory: Normal effort Cardiac: Regular rate and rhythm  Abdomen: No CVA tenderness Neurologic: Nonfocal   Results/Data Urine [Data Includes: Last 1 Day]   30Oct2014 COLOR STRAW  APPEARANCE CLEAR  SPECIFIC GRAVITY 1.010  pH 6.0  GLUCOSE NEG mg/dL BILIRUBIN NEG  KETONE NEG mg/dL BLOOD TRACE  PROTEIN NEG mg/dL UROBILINOGEN 0.2 mg/dL NITRITE NEG  LEUKOCYTE ESTERASE TRACE  SQUAMOUS EPITHELIAL/HPF RARE  WBC 0-2 WBC/hpf RBC 0-2 RBC/hpf BACTERIA NONE SEEN  CRYSTALS NONE SEEN  CASTS NONE SEEN   Assessment Assessed  1. Nephrolithiasis (592.0)  End of Encounter Meds  Medication Name Instruction Aspirin Low Dose 81 MG Oral Tablet TAKE 1 TABLET DAILY. Bellamine TABS  Hydrochlorothiazide 25 MG Oral Tablet take 1  tablet by mouth once daily Vitamin D TABS 1000u 1 per day  Plan Health Maintenance  1. UA With REFLEX; Status:Complete;   Done: 30Oct2014 02:26PM Nephrolithiasis  2. COMPREHENSIVE METABOLIC PANEL; Status:Hold For - Specimen/Data  Collection,Appointment; Requested for:30Oct2014;  3. KUB; Status:Hold For - Appointment,Date of Service; Requested for:30Oct2014;  4. Follow-up Month x 6 Office  Follow-up  Status: Hold For - Appointment,Date of Service   Requested for: 30Oct2014  Discussion/Summary   At this point, I do feel she has had an increase in stone burden on the left side. We did talk about the pros and cons of proceeding with additional elective PSWL. She is interested in potentially postponing this until she is medicare eligible, which will be about a year. Rather than wait 12 months, I have encouraged her to come in in 6 months and have another KUB to be sure there has not been a dramatic change in that stone burden. I also definitely think it is time for her to have a reassessment of her metabolic studies and I have encouraged her to call [facility] so we can do additional Uro testing. Thiazide diuretics often do lose their effectiveness and I would like to see what her urinary calcium levels are. We will also get a comprehensive metabolic panel on her including the serum calcium today.    Signatures Electronically signed by : Barron Alvine, M.D.; Oct 30 2012  5:10PM EST  Patient contacted me status post her most recent appointment 2 weeks ago requesting to go ahead with elective ESWL given the increased left-sided stone burden.  We felt that she was a reasonable candidate.  Risks and benefits fully discussed.

## 2012-11-17 ENCOUNTER — Encounter (HOSPITAL_COMMUNITY)
Admission: RE | Disposition: A | Discharge status: Home / Self Care | Payer: Self-pay | Source: Ambulatory Visit | Attending: Urology

## 2012-11-17 ENCOUNTER — Ambulatory Visit (HOSPITAL_COMMUNITY): Payer: BC Managed Care – PPO

## 2012-11-17 ENCOUNTER — Ambulatory Visit (HOSPITAL_COMMUNITY)
Admission: RE | Admit: 2012-11-17 | Discharge: 2012-11-17 | Disposition: A | Discharge status: Home / Self Care | Payer: BC Managed Care – PPO | Source: Ambulatory Visit | Attending: Urology | Admitting: Urology

## 2012-11-17 ENCOUNTER — Encounter (HOSPITAL_COMMUNITY): Payer: Self-pay | Admitting: *Deleted

## 2012-11-17 DIAGNOSIS — Z79899 Other long term (current) drug therapy: Secondary | ICD-10-CM | POA: Insufficient documentation

## 2012-11-17 DIAGNOSIS — N200 Calculus of kidney: Secondary | ICD-10-CM | POA: Insufficient documentation

## 2012-11-17 DIAGNOSIS — Z7982 Long term (current) use of aspirin: Secondary | ICD-10-CM | POA: Insufficient documentation

## 2012-11-17 SURGERY — LITHOTRIPSY, ESWL
Anesthesia: LOCAL | Laterality: Left

## 2012-11-17 MED ORDER — CIPROFLOXACIN HCL 500 MG PO TABS
500.0000 mg | ORAL_TABLET | ORAL | Status: AC
  Administered 2012-11-17: 500 mg via ORAL
  Filled 2012-11-17: qty 1

## 2012-11-17 MED ORDER — DEXTROSE-NACL 5-0.45 % IV SOLN
INTRAVENOUS | Status: DC
  Administered 2012-11-17: 11:00:00 via INTRAVENOUS

## 2012-11-17 MED ORDER — DIAZEPAM 5 MG PO TABS
10.0000 mg | ORAL_TABLET | ORAL | Status: AC
  Administered 2012-11-17: 10 mg via ORAL
  Filled 2012-11-17: qty 2

## 2012-11-17 MED ORDER — HYDROCODONE-ACETAMINOPHEN 5-325 MG PO TABS: 1.0000 | ORAL_TABLET | Freq: Four times a day (QID) | ORAL | Status: DC | PRN

## 2012-11-17 MED ORDER — DIPHENHYDRAMINE HCL 25 MG PO CAPS
25.0000 mg | ORAL_CAPSULE | ORAL | Status: AC
  Administered 2012-11-17: 25 mg via ORAL
  Filled 2012-11-17: qty 1

## 2012-11-17 NOTE — Op Note (Signed)
See [facility] OP note scanned into chart. 

## 2012-11-17 NOTE — Interval H&P Note (Signed)
History and Physical Interval Note:  11/17/2012 11:31 AM  Bridget Weeks  has presented today for surgery, with the diagnosis of Left Renal Calculi  The various methods of treatment have been discussed with the patient and family. After consideration of risks, benefits and other options for treatment, the patient has consented to  Procedure(Weeks): LEFT EXTRACORPOREAL SHOCK WAVE LITHOTRIPSY (ESWL) (Left) as a surgical intervention .  The patient'Weeks history has been reviewed, patient examined, no change in status, stable for surgery.  I have reviewed the patient'Weeks chart and labs.  Questions were answered to the patient'Weeks satisfaction.     Bridget Weeks

## 2013-11-02 ENCOUNTER — Encounter (HOSPITAL_COMMUNITY): Payer: Self-pay | Admitting: *Deleted

## 2016-01-02 DIAGNOSIS — C801 Malignant (primary) neoplasm, unspecified: Secondary | ICD-10-CM

## 2016-01-02 HISTORY — DX: Malignant (primary) neoplasm, unspecified: C80.1

## 2018-11-18 ENCOUNTER — Other Ambulatory Visit: Payer: Self-pay

## 2018-11-18 DIAGNOSIS — Z20822 Contact with and (suspected) exposure to covid-19: Secondary | ICD-10-CM

## 2018-11-20 LAB — NOVEL CORONAVIRUS, NAA: SARS-CoV-2, NAA: NOT DETECTED

## 2019-01-06 ENCOUNTER — Ambulatory Visit: Payer: Medicare Other

## 2019-01-06 ENCOUNTER — Encounter: Payer: Self-pay | Admitting: Emergency Medicine

## 2019-01-06 ENCOUNTER — Other Ambulatory Visit: Payer: Self-pay

## 2019-01-06 ENCOUNTER — Ambulatory Visit
Admission: EM | Admit: 2019-01-06 | Discharge: 2019-01-06 | Disposition: A | Discharge status: Home / Self Care | Payer: Medicare Other | Attending: Family Medicine | Admitting: Family Medicine

## 2019-01-06 DIAGNOSIS — W2209XA Striking against other stationary object, initial encounter: Secondary | ICD-10-CM

## 2019-01-06 DIAGNOSIS — M79674 Pain in right toe(s): Secondary | ICD-10-CM

## 2019-01-06 MED ORDER — MELOXICAM 15 MG PO TABS: 15.0000 mg | ORAL_TABLET | Freq: Every day | ORAL | 0 refills | Status: AC | PRN

## 2019-01-06 NOTE — Discharge Instructions (Signed)
Good supportive shoes.  Medication as needed.  Take care  Dr. Lacinda Axon

## 2019-01-06 NOTE — ED Provider Notes (Signed)
[facility]    CSN: [id number] Arrival date & time: 01/06/19  1149      History   Chief Complaint Chief Complaint  Patient presents with  . Toe Pain    HPI  69 year old female presents with toe pain.  Patient reports that she got up to take her dog outside and inadvertently hit her right foot on a chair.  She states that she injured her right third toe.  She reports swelling and pain.  She states that her second toe seems to be swollen as well.  Patient rates her pain as 8/10 in severity currently.  No known exacerbating or relieving factors.  No other reported injuries.  No other symptoms.  No other complaints.  PMH, Surgical Hx, Family Hx, Social History reviewed and updated as below.  Past Medical History:  Diagnosis Date  . Cancer ([place]) 2018   breast  . 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 Hypertension    Pt claims never diagnosed for this. Takes HCTZ to help prevent kidney stones  . Low bone mass 2009   DEXA -1.9 AT [facility]IP [facility]  . Renal stones     Patient Active Problem List   Diagnosis Date Noted  . Renal stones   . Hypertension   . Low bone mass   . Elevated prolactin level     Past Surgical History:  Procedure Laterality Date  . BREAST SURGERY     partial mastectomy  . [place]  . LITHOTRIPSY  2009  . vaginal deliveries  1984, 1987    OB History    Gravida  2   Para  2   Term      Preterm      AB  0   Living  2     SAB      TAB      Ectopic      Multiple      Live Births               Home Medications    Prior to Admission medications   Medication Sig Start Date End Date Taking? Authorizing Provider  Cholecalciferol (VITAMIN D) 2000 UNITS tablet Take 2,000 Units by mouth daily.   Yes [provider]  hydrochlorothiazide (MICROZIDE) 12.5 MG capsule Take 12.5 mg by mouth every morning.    Yes [provider]  OVER THE  COUNTER MEDICATION Take 2 capsules by mouth daily. Bella Femme-prevent hot flashes   Yes [provider]  meloxicam (MOBIC) 15 MG tablet Take 1 tablet (15 mg total) by mouth daily as needed for pain. 01/06/19   Coral Spikes, DO    Family History Family History  Problem Relation Age of Onset  . Heart disease Mother   . Diabetes Mother   . Hypertension Mother   . Heart disease Father   . Hypertension Father     Social History Social History   Tobacco Use  . Smoking status: Passive Smoke Exposure - Never Smoker  . Smokeless tobacco: Never Used  Substance Use Topics  . Alcohol use: No  . Drug use: No     Allergies   Penicillins and Shellfish allergy   Review of Systems Review of Systems  Constitutional: Negative.   Musculoskeletal:       Toe pain.   Physical Exam Triage Vital Signs ED Triage Vitals  Enc Vitals Group  BP 01/06/19 1231 (!) 167/77     Pulse Rate 01/06/19 1231 75     Resp 01/06/19 1231 18     Temp 01/06/19 1231 98.7 F (37.1 C)     Temp Source 01/06/19 1231 Oral     SpO2 01/06/19 1231 99 %     Weight 01/06/19 1231 213 lb (96.6 kg)     Height 01/06/19 1231 5\' 10"  (1.778 m)     Head Circumference --      Peak Flow --      Pain Score 01/06/19 1230 8     Pain Loc --      Pain Edu? --      Excl. in [place]? --    No data found.  Updated Vital Signs BP (!) 167/77 (BP Location: Left Arm)   Pulse 75   Temp 98.7 F (37.1 C) (Oral)   Resp 18   Ht 5\' 10"  (1.778 m)   Wt 96.6 kg   SpO2 99%   BMI 30.56 kg/m   Visual Acuity Right Eye Distance:   Left Eye Distance:   Bilateral Distance:    Right Eye Near:   Left Eye Near:    Bilateral Near:     Physical Exam Vitals and nursing note reviewed.  Constitutional:      General: She is not in acute distress.    Appearance: Normal appearance. She is not ill-appearing.  Eyes:     General:        Right eye: No discharge.        Left eye: No discharge.     Conjunctiva/sclera: Conjunctivae  normal.  Cardiovascular:     Rate and Rhythm: Normal rate and regular rhythm.     Heart sounds: No murmur.  Pulmonary:     Effort: Pulmonary effort is normal.     Breath sounds: Normal breath sounds. No wheezing, rhonchi or rales.  Musculoskeletal:     Comments: Swelling noted of the third toe of the right foot.  Tender to palpation.  Neurological:     Mental Status: She is alert.  Psychiatric:        Mood and Affect: Mood normal.        Behavior: Behavior normal.    UC Treatments / Results  Labs (all labs ordered are listed, but only abnormal results are displayed) Labs Reviewed - No data to display  EKG   Radiology DG Foot Complete Right  Result Date: 01/06/2019 CLINICAL DATA:  Pain and swelling after blunt trauma one week ago. EXAM: RIGHT FOOT COMPLETE - 3+ VIEW COMPARISON:  None. FINDINGS: There is no evidence of fracture or dislocation. There is no evidence of arthropathy or other focal bone abnormality. Soft tissues are unremarkable. IMPRESSION: Negative. Electronically Signed   By: Lorriane Shire M.D.   On: 01/06/2019 13:11    Procedures Procedures (including critical care time)  Medications Ordered in UC Medications - No data to display  Initial Impression / Assessment and Plan / UC Course  I have reviewed the triage vital signs and the nursing notes.  Pertinent labs & imaging results that were available during my care of the patient were reviewed by me and considered in my medical decision making (see chart for details).    69 year old female presents with toe injury.  X-ray negative.  Meloxicam as directed.  Supportive care.  Final Clinical Impressions(s) / UC Diagnoses   Final diagnoses:  Pain of toe of right foot     Discharge Instructions  Good supportive shoes.  Medication as needed.  Take care  Dr. Lacinda Axon    ED Prescriptions    Medication Sig Dispense Auth. Provider   meloxicam (MOBIC) 15 MG tablet Take 1 tablet (15 mg total) by mouth  daily as needed for pain. 14 tablet Coral Spikes, DO     PDMP not reviewed this encounter.   Coral Spikes, [place] 01/06/19 1416

## 2019-01-06 NOTE — ED Triage Notes (Signed)
Patient states she injured 2 of her toes on her right foot when she accidentally hit her toe on the foot of a chair 1 week. She is c/o pain and swelling.
